# Patient Record
Sex: Male | Born: 1965 | Race: White | Hispanic: No | Marital: Married
Health system: Southern US, Community
[De-identification: ages and names within clinical notes are randomized; demographics above are authoritative.]

---

## 2016-06-06 DIAGNOSIS — J00 Acute nasopharyngitis [common cold]: Secondary | ICD-10-CM | POA: Diagnosis not present

## 2016-08-23 DIAGNOSIS — Z0000 Encounter for general adult medical examination without abnormal findings: Secondary | ICD-10-CM | POA: Diagnosis not present

## 2016-08-23 DIAGNOSIS — Z23 Encounter for immunization: Secondary | ICD-10-CM | POA: Diagnosis not present

## 2016-08-23 DIAGNOSIS — Z0184 Encounter for antibody response examination: Secondary | ICD-10-CM | POA: Diagnosis not present

## 2016-08-23 DIAGNOSIS — R1011 Right upper quadrant pain: Secondary | ICD-10-CM | POA: Diagnosis not present

## 2016-08-28 ENCOUNTER — Other Ambulatory Visit: Payer: Self-pay | Admitting: Family Medicine

## 2016-08-28 DIAGNOSIS — R1011 Right upper quadrant pain: Secondary | ICD-10-CM

## 2016-09-18 ENCOUNTER — Ambulatory Visit
Admission: RE | Admit: 2016-09-18 | Discharge: 2016-09-18 | Disposition: A | Discharge status: Home / Self Care | Payer: 59 | Source: Ambulatory Visit | Attending: Family Medicine | Admitting: Family Medicine

## 2016-09-18 DIAGNOSIS — R1011 Right upper quadrant pain: Secondary | ICD-10-CM

## 2017-08-06 DIAGNOSIS — S4352XA Sprain of left acromioclavicular joint, initial encounter: Secondary | ICD-10-CM | POA: Diagnosis not present

## 2018-05-23 DIAGNOSIS — Z1211 Encounter for screening for malignant neoplasm of colon: Secondary | ICD-10-CM | POA: Diagnosis not present

## 2018-05-23 DIAGNOSIS — Z125 Encounter for screening for malignant neoplasm of prostate: Secondary | ICD-10-CM | POA: Diagnosis not present

## 2018-05-23 DIAGNOSIS — R109 Unspecified abdominal pain: Secondary | ICD-10-CM | POA: Diagnosis not present

## 2018-06-09 DIAGNOSIS — J301 Allergic rhinitis due to pollen: Secondary | ICD-10-CM | POA: Diagnosis not present

## 2019-05-26 IMAGING — US US ABDOMEN LIMITED
1 series · 14 of 25 positions shown · non-contrast
Comparison: None.

CLINICAL DATA: Intermittent right upper quadrant pain over the last
6 months

EXAM:
ULTRASOUND ABDOMEN LIMITED RIGHT UPPER QUADRANT

[Series 1: us abdomen limited · 0.20mm/px · 14 of 34 slices shown]
[im 1/34]
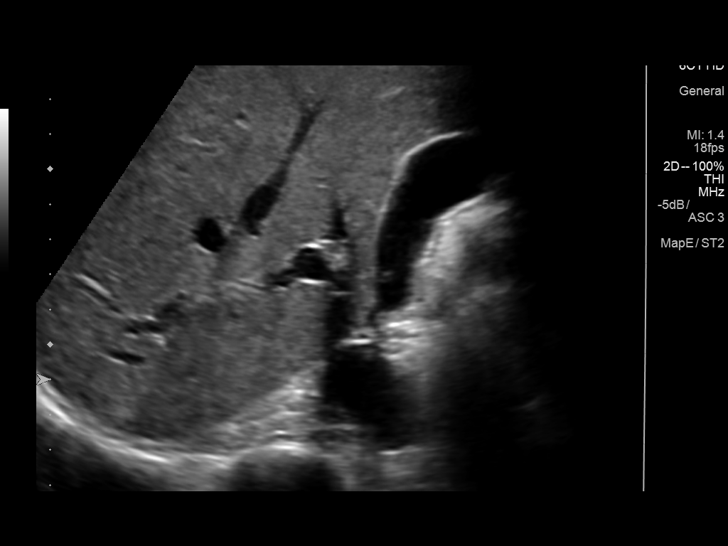
[im 3/34]
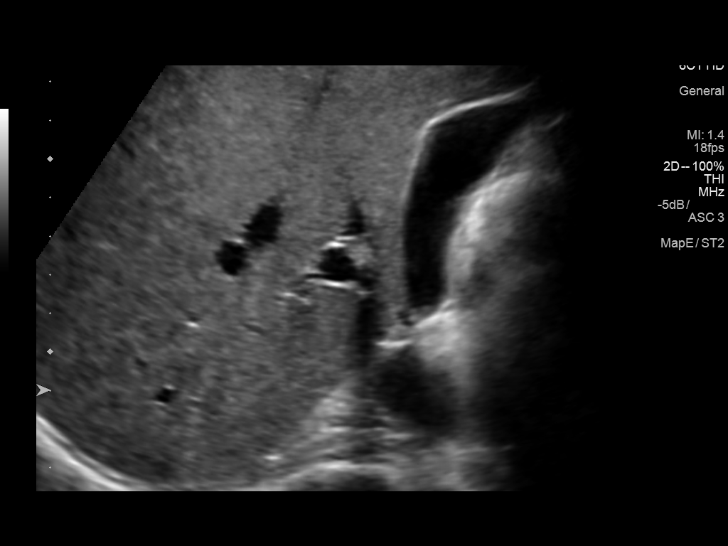
[im 6/34]
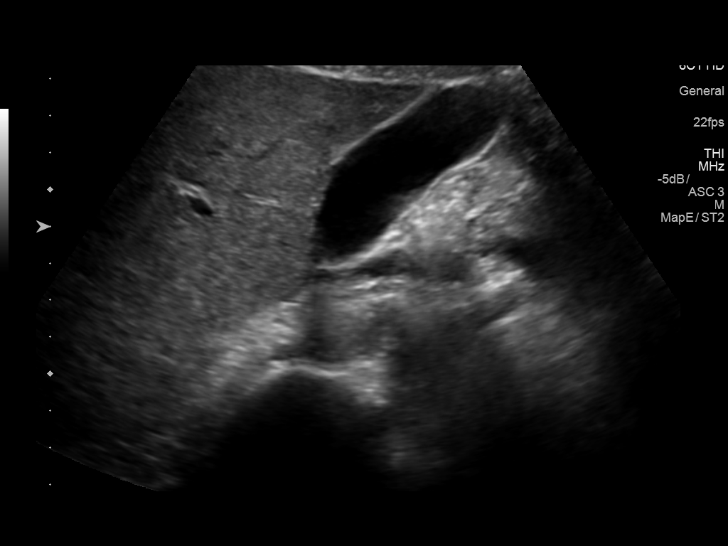
[im 9/34]
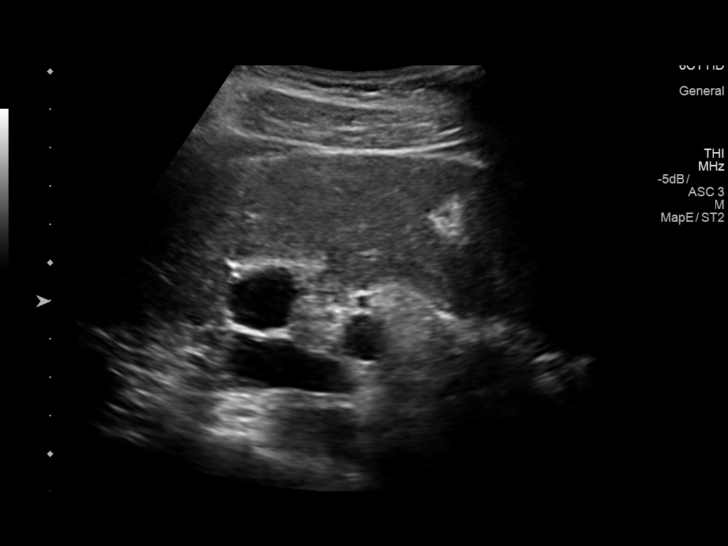
[im 12/34]
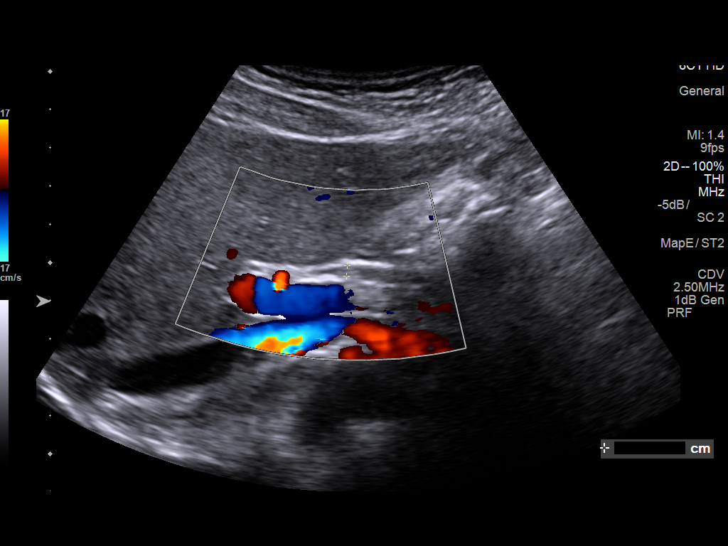
[im 13/34]
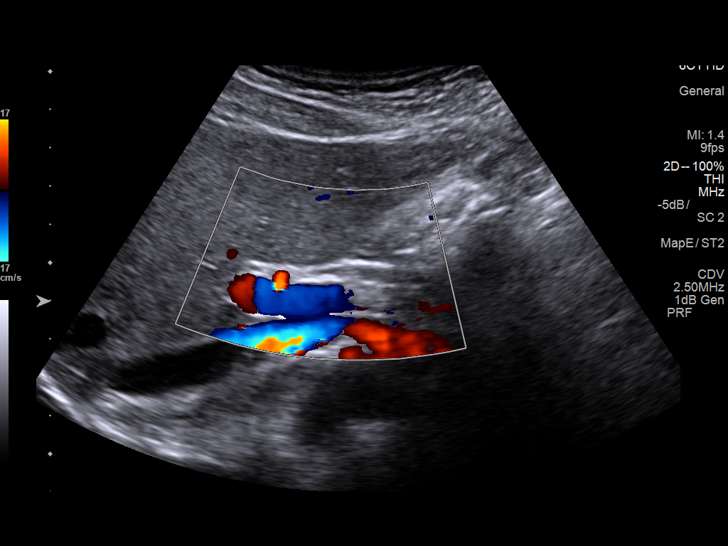
[im 16/34]
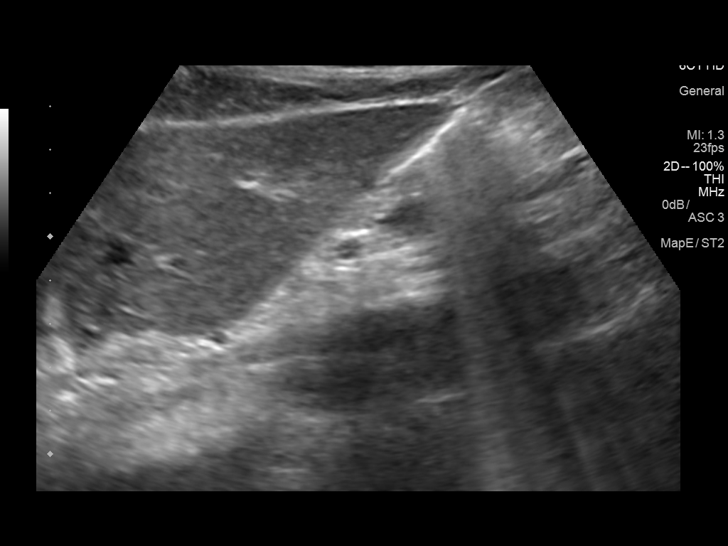
[im 18/34]
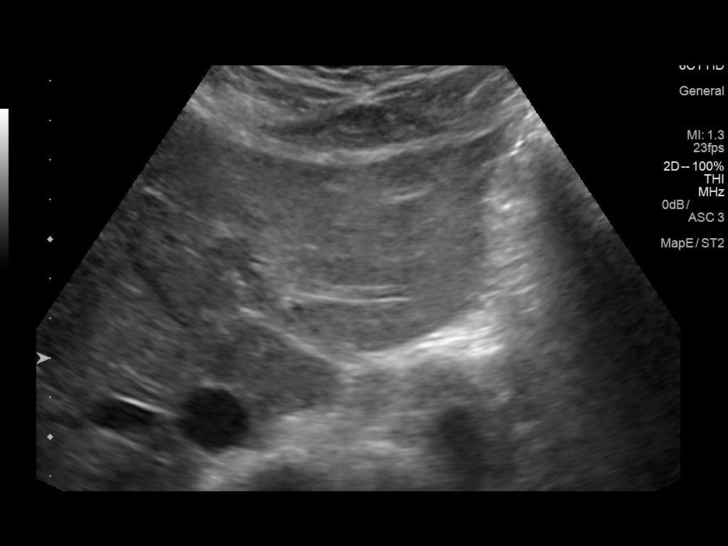
[im 21/34]
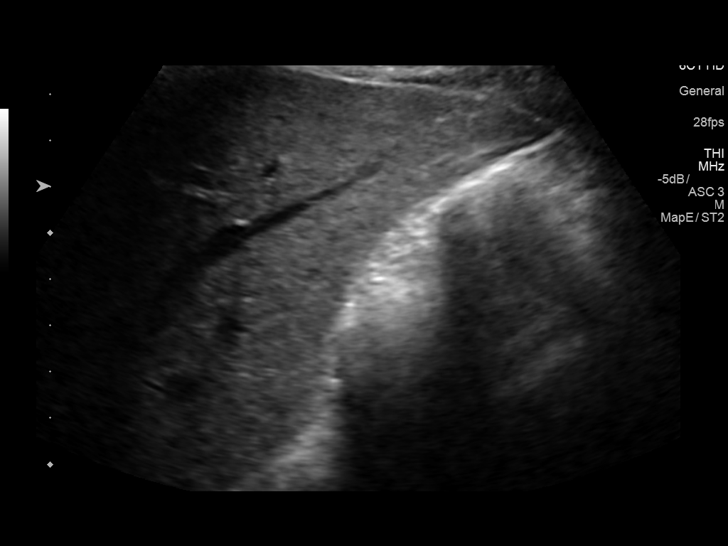
[im 23/34]
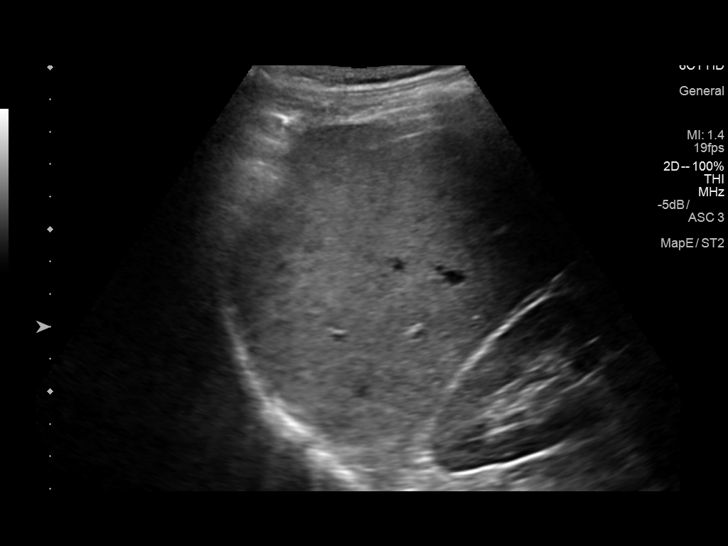
[im 25/34]
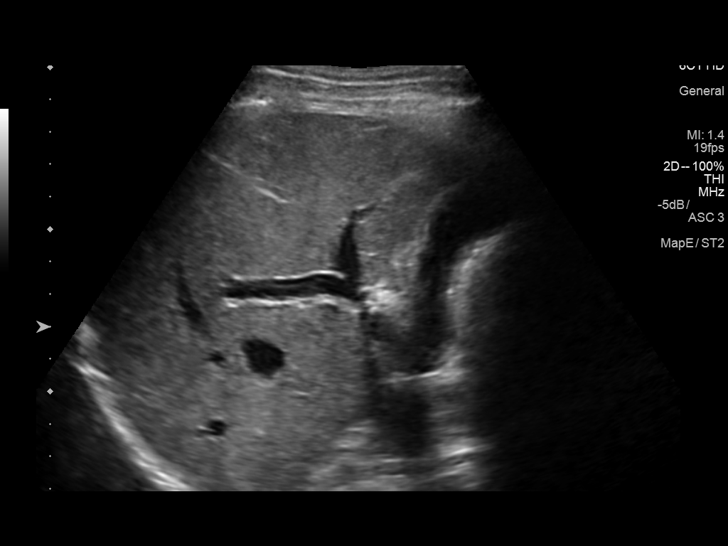
[im 28/34]
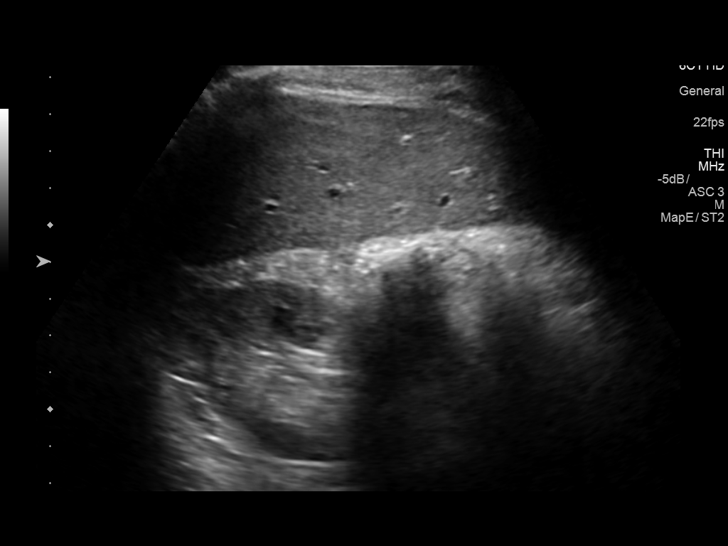
[im 31/34]
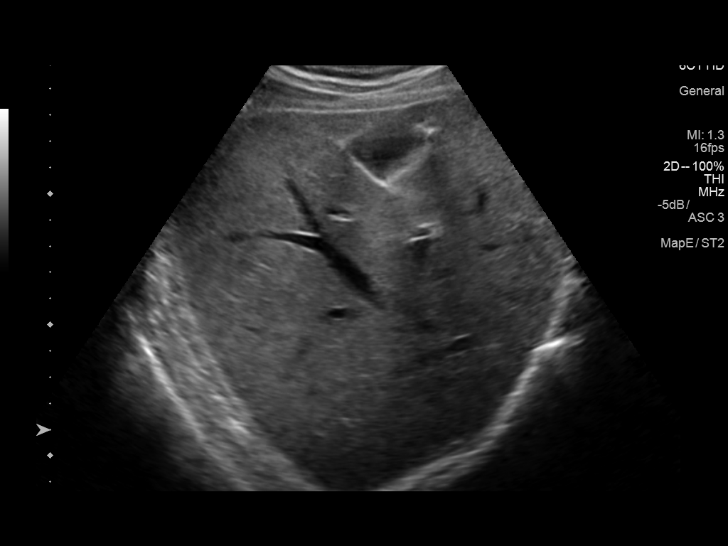
[im 34/34]
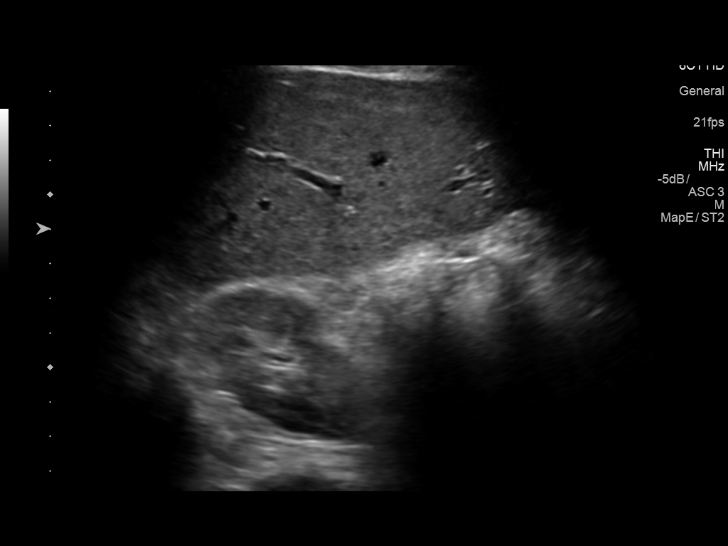

[14 of 25 positions shown; findings below may reference images not displayed]

FINDINGS: Gallbladder:

The gallbladder is visualized and no gallstones are noted. There is
no pain over the gallbladder with compression.

Common bile duct:

Diameter: The common bile duct is normal measuring 2.8 mm in
diameter.

Liver:

The liver has a normal echogenic pattern. No focal hepatic
abnormality is seen. Hepatopetal portal vein flow is noted.
IMPRESSION: Negative ultrasound the right upper quadrant.

## 2019-05-28 ENCOUNTER — Ambulatory Visit: Payer: 59 | Attending: Internal Medicine

## 2019-05-28 DIAGNOSIS — Z23 Encounter for immunization: Secondary | ICD-10-CM

## 2019-05-28 NOTE — Progress Notes (Signed)
   Covid-[facility] Vaccination [facility]  Name:  Rihan Schueler    MRN: [id number] DOB: [birth year]  05/28/2019  Mr. Theiss was observed post Covid-19 immunization for 15 minutes without incident. He was provided with Vaccine Information Sheet and instruction to access the V-Safe system.   Mr. Pote was instructed to call 911 with any severe reactions post vaccine: Marland Kitchen Difficulty breathing  . Swelling of face and throat  . A fast heartbeat  . A bad rash all over body  . Dizziness and weakness   Immunizations Administered    Name Date Dose VIS Date Route   Pfizer COVID-19 Vaccine 05/28/2019 12:58 PM 0.3 mL 02/20/2019 Intramuscular   Manufacturer: [employer], [employer]   Lot: [id number]   NDC: [address]-8

## 2019-06-22 ENCOUNTER — Ambulatory Visit: Payer: 59 | Attending: Internal Medicine

## 2019-06-22 DIAGNOSIS — Z23 Encounter for immunization: Secondary | ICD-10-CM

## 2019-06-22 NOTE — Progress Notes (Signed)
   Covid-19 Vaccination Clinic  Name:  Anshul Meddings    MRN: [id number] DOB: Aug 06, 1965  06/22/2019  Mr. Proano was observed post Covid-19 immunization for 15 minutes without incident. He was provided with Vaccine Information Sheet and instruction to access the V-Safe system.   Mr. Turpen was instructed to call 911 with any severe reactions post vaccine: Marland Kitchen Difficulty breathing  . Swelling of face and throat  . A fast heartbeat  . A bad rash all over body  . Dizziness and weakness   Immunizations Administered    Name Date Dose VIS Date Route   Pfizer COVID-19 Vaccine 06/22/2019  2:16 PM 0.3 mL 02/20/2019 Intramuscular   Manufacturer: [employer], [employer]   Lot: [id number]   NDC: [address]-5

## 2021-10-11 ENCOUNTER — Other Ambulatory Visit: Payer: Self-pay | Admitting: Physician Assistant

## 2021-10-11 DIAGNOSIS — R1011 Right upper quadrant pain: Secondary | ICD-10-CM

## 2021-11-07 ENCOUNTER — Ambulatory Visit
Admission: RE | Admit: 2021-11-07 | Discharge: 2021-11-07 | Disposition: A | Discharge status: Home / Self Care | Payer: 59 | Source: Ambulatory Visit | Attending: Physician Assistant | Admitting: Physician Assistant

## 2021-11-07 DIAGNOSIS — R1011 Right upper quadrant pain: Secondary | ICD-10-CM
